# Patient Record
Sex: Male | Born: 1970
Health system: Southern US, Community
[De-identification: ages and names within clinical notes are randomized; demographics above are authoritative.]

## PROBLEM LIST (undated history)

## (undated) DIAGNOSIS — I10 Essential (primary) hypertension: Secondary | ICD-10-CM

## (undated) DIAGNOSIS — K5090 Crohn's disease, unspecified, without complications: Secondary | ICD-10-CM

## (undated) DIAGNOSIS — R7303 Prediabetes: Secondary | ICD-10-CM

## (undated) HISTORY — PX: SCROTUM EXPLORATION: SHX2389

---

## 2008-05-20 ENCOUNTER — Ambulatory Visit (HOSPITAL_COMMUNITY): Admission: RE | Admit: 2008-05-20 | Discharge: 2008-05-21 | Payer: Self-pay | Admitting: Urology

## 2010-05-13 LAB — COMPREHENSIVE METABOLIC PANEL
Albumin: 3.2 g/dL — ABNORMAL LOW (ref 3.5–5.2)
Alkaline Phosphatase: 71 U/L (ref 39–117)
BUN: 13 mg/dL (ref 6–23)
Potassium: 4 mEq/L (ref 3.5–5.1)
Total Protein: 7.3 g/dL (ref 6.0–8.3)

## 2010-05-13 LAB — ANAEROBIC CULTURE

## 2010-05-13 LAB — CBC
HCT: 36.8 % — ABNORMAL LOW (ref 39.0–52.0)
HCT: 39.5 % (ref 39.0–52.0)
Hemoglobin: 12.7 g/dL — ABNORMAL LOW (ref 13.0–17.0)
MCHC: 34.5 g/dL (ref 30.0–36.0)
MCV: 82.8 fL (ref 78.0–100.0)
Platelets: 202 10*3/uL (ref 150–400)
RBC: 4.45 MIL/uL (ref 4.22–5.81)
RDW: 15 % (ref 11.5–15.5)

## 2010-05-13 LAB — CULTURE, ROUTINE-ABSCESS

## 2010-06-16 NOTE — Op Note (Signed)
[name], [name] NO.:  [id number]   MEDICAL RECORD NO.:  [id number]          PATIENT TYPE:  AMB   LOCATION:  DAY                          FACILITY:  [facility]   PHYSICIAN:  Clayton Obrien, M.D.    DATE OF BIRTH:  03-24-1970   DATE OF PROCEDURE:  05/20/2008  DATE OF DISCHARGE:                               OPERATIVE REPORT   PREOPERATIVE DIAGNOSIS:  Scrotal abscess.   POSTOPERATIVE DIAGNOSIS:  Fournier's gangrene.   PROCEDURE:  Scrotal exploration with debridement of necrotic tissue.   SURGEON:  Dr. Bjorn Pippin.   ANESTHESIA:  General.   SPECIMEN:  Wound cultures and debrided tissue.   DRAINS:  None.   COMPLICATIONS:  None.   INDICATIONS:  Clayton Obrien is a 40 year old African American male who had the  onset last Thursday of a small blow on the penis.  He attempted to  express pus and after that it became more inflamed, he developed a  fever.  He was seen at [facility] over the weekend where I and D was  performed.  He followed up earlier today with them and they felt that he  was having a continuing problem.  He had been given Rocephin and Septra.  He was seen in the office where he had two areas of incision that were  packed with somewhat desquamating scanned with an additional area of  purulent drainage.  It was felt that formal I and D was indicated.   FINDINGS AND PROCEDURE:  The patient was taken to the operating room.  He was given Cipro and Unasyn.  A general anesthetic was induced.  He  was placed in lithotomy position.  His perineum and genitalia were  prepped with Betadine solution and he was draped in the usual sterile  fashion.  The packing had been removed prior to prepping.  Once prepped  and draped, a timeout was performed.  He then underwent initial incision  between the two areas of prior incision, exposing a large abscess  cavity.  However, on inspection there appeared to be necrotic tissue at  the edges of the wound with the overlying  areas of skin desquamated.  Further probing revealed progression of the infection into the space  around the base of the penis and toward the left inguinal area.  Additional pockets of pus were broken down and areas of necrotic  appearing subdartos fascia was identified.  This was dissected out back  to healthy tissue.  Once all areas of devitalized tissue were identified  and debrided, packing was performed using a 1-  inch Iodoform gauze.  Once the area had been packed and no additional  pockets of pus identified, a 4x4 dressing was applied followed by fluff  Kerlix followed by a scrotal support.  The patient's anesthetic was  reversed.  He was then moved to the recovery room in stable condition.  There were no complications.      Clayton Obrien, M.D.  Electronically Signed     JJW/MEDQ  D:  05/20/2008  T:  05/20/2008  Job:  [id number]   cc:  [facility]  [place]

## 2017-09-30 ENCOUNTER — Ambulatory Visit
Admission: RE | Admit: 2017-09-30 | Discharge: 2017-09-30 | Disposition: A | Discharge status: Home / Self Care | Payer: Managed Care, Other (non HMO) | Source: Ambulatory Visit | Attending: Gastroenterology | Admitting: Gastroenterology

## 2017-09-30 ENCOUNTER — Other Ambulatory Visit: Payer: Self-pay | Admitting: Gastroenterology

## 2017-09-30 DIAGNOSIS — K5010 Crohn's disease of large intestine without complications: Secondary | ICD-10-CM

## 2019-10-20 ENCOUNTER — Emergency Department (HOSPITAL_COMMUNITY)
Admission: EM | Admit: 2019-10-20 | Discharge: 2019-10-21 | Disposition: A | Discharge status: Home / Self Care | Payer: Managed Care, Other (non HMO) | Attending: Emergency Medicine | Admitting: Emergency Medicine

## 2019-10-20 DIAGNOSIS — Z5321 Procedure and treatment not carried out due to patient leaving prior to being seen by health care provider: Secondary | ICD-10-CM | POA: Diagnosis not present

## 2019-10-20 DIAGNOSIS — W5652XA Struck by other fish, initial encounter: Secondary | ICD-10-CM | POA: Diagnosis not present

## 2019-10-20 DIAGNOSIS — S8992XA Unspecified injury of left lower leg, initial encounter: Secondary | ICD-10-CM | POA: Diagnosis present

## 2019-10-20 HISTORY — DX: Essential (primary) hypertension: I10

## 2019-10-21 ENCOUNTER — Encounter (HOSPITAL_COMMUNITY): Payer: Self-pay

## 2019-10-21 NOTE — ED Triage Notes (Signed)
Pt arrives to ED w/ fishing hook impaled in LLE. Pt denies pain at this time. Bleeding controlled.

## 2019-10-21 NOTE — ED Notes (Signed)
PA at triage to eval pt.  No answer in waiting room x 2

## 2019-10-21 NOTE — ED Notes (Signed)
No answer for treatment room. 

## 2020-05-23 ENCOUNTER — Other Ambulatory Visit: Payer: Self-pay | Admitting: Family Medicine

## 2020-05-23 DIAGNOSIS — N480 Leukoplakia of penis: Secondary | ICD-10-CM

## 2020-05-23 DIAGNOSIS — N35911 Unspecified urethral stricture, male, meatal: Secondary | ICD-10-CM

## 2020-05-23 DIAGNOSIS — N390 Urinary tract infection, site not specified: Secondary | ICD-10-CM

## 2020-06-06 ENCOUNTER — Ambulatory Visit
Admission: RE | Admit: 2020-06-06 | Discharge: 2020-06-06 | Disposition: A | Discharge status: Home / Self Care | Payer: 59 | Source: Ambulatory Visit | Attending: Family Medicine | Admitting: Family Medicine

## 2020-06-06 ENCOUNTER — Encounter (INDEPENDENT_AMBULATORY_CARE_PROVIDER_SITE_OTHER): Payer: Self-pay

## 2020-06-06 DIAGNOSIS — N35911 Unspecified urethral stricture, male, meatal: Secondary | ICD-10-CM

## 2020-06-06 DIAGNOSIS — N390 Urinary tract infection, site not specified: Secondary | ICD-10-CM

## 2020-06-06 DIAGNOSIS — N480 Leukoplakia of penis: Secondary | ICD-10-CM

## 2020-06-06 MED ORDER — IOPAMIDOL (ISOVUE-300) INJECTION 61%
100.0000 mL | Freq: Once | INTRAVENOUS | Status: AC | PRN
  Administered 2020-06-06: 100 mL via INTRAVENOUS

## 2020-06-17 ENCOUNTER — Other Ambulatory Visit: Payer: Self-pay | Admitting: Urology

## 2020-07-03 NOTE — Patient Instructions (Signed)
DUE TO COVID-19 ONLY ONE VISITOR IS ALLOWED TO COME WITH YOU AND STAY IN THE WAITING ROOM ONLY DURING PRE OP AND PROCEDURE DAY OF SURGERY. THE 1 VISITOR  MAY VISIT WITH YOU AFTER SURGERY IN YOUR PRIVATE ROOM DURING VISITING HOURS ONLY!               Clayton Obrien   Your procedure is scheduled on: 07/11/20   Report to [facility] Main  Entrance   Report to short stay at: 5:15 AM     Call this number if you have problems the morning of surgery [phone]    Remember: Do not eat food or drink liquids :After Midnight.   BRUSH YOUR TEETH MORNING OF SURGERY AND RINSE YOUR MOUTH OUT, NO CHEWING GUM CANDY OR MINTS.    Take these medicines the morning of surgery with A SIP OF WATER: azathioprine.                               You may not have any metal on your body including hair pins and              piercings  Do not wear jewelry, lotions, powders or perfumes, deodorant.              Men may shave face and neck.   Do not bring valuables to the hospital. [place] IS NOT             RESPONSIBLE   FOR VALUABLES.  Contacts, dentures or bridgework may not be worn into surgery.  Leave suitcase in the car. After surgery it may be brought to your room.     Patients discharged the day of surgery will not be allowed to drive home. IF YOU ARE HAVING SURGERY AND GOING HOME THE SAME DAY, YOU MUST HAVE AN ADULT TO DRIVE YOU HOME AND BE WITH YOU FOR 24 HOURS. YOU MAY GO HOME BY TAXI OR UBER OR ORTHERWISE, BUT AN ADULT MUST ACCOMPANY YOU HOME AND STAY WITH YOU FOR 24 HOURS.  Name and phone number of your driver:  Special Instructions: N/A              Please read over the following fact sheets you were given: _____________________________________________________________________         [facility] - Preparing for Surgery Before surgery, you can play an important role.  Because skin is not sterile, your skin needs to be as free of germs as possible.  You can reduce the number of germs on  your skin by washing with CHG (chlorahexidine gluconate) soap before surgery.  CHG is an antiseptic cleaner which kills germs and bonds with the skin to continue killing germs even after washing. Please DO NOT use if you have an allergy to CHG or antibacterial soaps.  If your skin becomes reddened/irritated stop using the CHG and inform your nurse when you arrive at Short Stay. Do not shave (including legs and underarms) for at least 48 hours prior to the first CHG shower.  You may shave your face/neck. Please follow these instructions carefully:  1.  Shower with CHG Soap the night before surgery and the  morning of Surgery.  2.  If you choose to wash your hair, wash your hair first as usual with your  normal  shampoo.  3.  After you shampoo, rinse your hair and body thoroughly to remove the  shampoo.  4.  Use CHG as you would any other liquid soap.  You can apply chg directly  to the skin and wash                       Gently with a scrungie or clean washcloth.  5.  Apply the CHG Soap to your body ONLY FROM THE NECK DOWN.   Do not use on face/ open                           Wound or open sores. Avoid contact with eyes, ears mouth and genitals (private parts).                       Wash face,  Genitals (private parts) with your normal soap.             6.  Wash thoroughly, paying special attention to the area where your surgery  will be performed.  7.  Thoroughly rinse your body with warm water from the neck down.  8.  DO NOT shower/wash with your normal soap after using and rinsing off  the CHG Soap.                9.  Pat yourself dry with a clean towel.            10.  Wear clean pajamas.            11.  Place clean sheets on your bed the night of your first shower and do not  sleep with pets. Day of Surgery : Do not apply any lotions/deodorants the morning of surgery.  Please wear clean clothes to the hospital/surgery center.  FAILURE TO FOLLOW THESE INSTRUCTIONS MAY  RESULT IN THE CANCELLATION OF YOUR SURGERY PATIENT SIGNATURE_________________________________  NURSE SIGNATURE__________________________________  ________________________________________________________________________

## 2020-07-04 ENCOUNTER — Encounter (HOSPITAL_COMMUNITY): Payer: Self-pay

## 2020-07-04 ENCOUNTER — Other Ambulatory Visit: Payer: Self-pay

## 2020-07-04 ENCOUNTER — Encounter (HOSPITAL_COMMUNITY)
Admission: RE | Admit: 2020-07-04 | Discharge: 2020-07-04 | Disposition: A | Discharge status: Home / Self Care | Payer: 59 | Source: Ambulatory Visit | Attending: Urology | Admitting: Urology

## 2020-07-04 DIAGNOSIS — Z01818 Encounter for other preprocedural examination: Secondary | ICD-10-CM | POA: Diagnosis not present

## 2020-07-04 HISTORY — DX: Prediabetes: R73.03

## 2020-07-04 HISTORY — DX: Crohn's disease, unspecified, without complications: K50.90

## 2020-07-04 LAB — CBC
HCT: 43.8 % (ref 39.0–52.0)
Hemoglobin: 14 g/dL (ref 13.0–17.0)
MCH: 26.5 pg (ref 26.0–34.0)
MCHC: 32 g/dL (ref 30.0–36.0)
MCV: 83 fL (ref 80.0–100.0)
Platelets: 284 10*3/uL (ref 150–400)
RBC: 5.28 MIL/uL (ref 4.22–5.81)
RDW: 16.5 % — ABNORMAL HIGH (ref 11.5–15.5)
WBC: 8.2 10*3/uL (ref 4.0–10.5)
nRBC: 0 % (ref 0.0–0.2)

## 2020-07-04 LAB — BASIC METABOLIC PANEL
Anion gap: 10 (ref 5–15)
BUN: 23 mg/dL — ABNORMAL HIGH (ref 6–20)
CO2: 26 mmol/L (ref 22–32)
Calcium: 10 mg/dL (ref 8.9–10.3)
Chloride: 102 mmol/L (ref 98–111)
Creatinine, Ser: 1.11 mg/dL (ref 0.61–1.24)
GFR, Estimated: >60 mL/min (ref >60)
Glucose, Bld: 118 mg/dL — ABNORMAL HIGH (ref 70–99)
Potassium: 4.5 mmol/L (ref 3.5–5.1)
Sodium: 138 mmol/L (ref 135–145)

## 2020-07-04 NOTE — Progress Notes (Signed)
COVID Vaccine Completed: Yes Date COVID Vaccine completed: 01/2020 [place] COVID vaccine manufacturer:   Moderna     PCP - Dr. Mila Palmer Cardiologist -   Chest x-ray -  EKG -  Stress Test -  ECHO -  Cardiac Cath -  Pacemaker/ICD device last checked:  Sleep Study -  CPAP -   Fasting Blood Sugar -  Checks Blood Sugar _____ times a day  Blood Thinner Instructions: Aspirin Instructions: Last Dose:  Anesthesia review:   Patient denies shortness of breath, fever, cough and chest pain at PAT appointment   Patient verbalized understanding of instructions that were given to them at the PAT appointment. Patient was also instructed that they will need to review over the PAT instructions again at home before surgery.

## 2020-07-05 LAB — HEMOGLOBIN A1C
Hgb A1c MFr Bld: 6.2 % — ABNORMAL HIGH (ref 4.8–5.6)
Mean Plasma Glucose: 131 mg/dL

## 2020-07-10 MED ORDER — GENTAMICIN SULFATE 40 MG/ML IJ SOLN
5.0000 mg/kg | INTRAVENOUS | Status: AC
  Administered 2020-07-11: 480 mg via INTRAVENOUS
  Filled 2020-07-10: qty 11.5

## 2020-07-10 NOTE — Op Note (Signed)
Operative Note  Preoperative diagnosis:  1.  Right stone 2. Urethral stricture  Postoperative diagnosis: 1.  Right renal stone 2. Urethral stricture  Procedure(s): 1.  Cystoscopy 2. Urethral dilation 3. Right ureteroscopy with laser lithotripsy and basket extraction of stones 4. Right retrograde pyelogram 5. Right ureteral stent placement 6. Fluoroscopy with intraoperative interpretation  Surgeon: Jettie Pagan, MD  Assistants:  None  Anesthesia:  General  Complications:  None  EBL:  Minimal  Specimens: 1. Stones for stone analysis (to be done at [facility])  Drains/Catheters: 1.  Right 6Fr x 26cm ureteral stent WITHOUT a tether string  Intraoperative findings:   Cystoscopy demonstrated pan urethral stricture from the meatus through the bulbar urethra.  Caliber was initially 8 [place].  The fossa navicularis was dilated from 8 [place] to 24 [place] with [employer].  Next a wire was placed into the bladder and the bulbar urethra was dilated using Hayman sounds from 12 [place] to 24 [place].  Cystoscopy demonstrated no suspicious bladder lesions.  His bilateral ureteral orifices were in their normal orthotopic position Ureteroscopy demonstrated 1.7 cm right renal pelvis stone which was actually quite soft.  It dusted excellently.  I was able to basket extract small to medium sized fragments.  The remainder of the fragments were scanned.  Ureteroscopy after placing access sheath did demonstrate grade 1 injury at the right proximal ureter and right distal ureter which was subcentimeter and only on the medial portion of the ureter. Successful right ureteral stent placement with curl within the renal pelvis and bladder respectively.  Indication:  Clayton Obrien is a 50 y.o. male with CT A/P 06/07/2020 with partially obstructing 1.7 cm stone in the right renal pelvis.  On CT imaging, he was found to have a skin to skin distance of approximately 18 cm.  Given this, we elected  proceed with ureteroscopy knowing that we may have to require multiple attempts.  He is amenable to this approach and elected proceed with right ureteroscopy with lithotripsy.  Description of procedure: After informed consent was obtained from the patient, the patient was identified and taken to the operating room and placed in the supine position.  General anesthesia was administered as well as perioperative IV antibiotics.  At the beginning of the case, a time-out was performed to properly identify the patient, the surgery to be performed, and the surgical site.  Sequential compression devices were applied to the lower extremities at the beginning of the case for DVT prophylaxis.  The patient was then placed in the dorsal lithotomy supine position, prepped and draped in sterile fashion.  Preliminary scout fluoroscopy revealed that there was a 1.7 cm calcification area at the right renal pelvis, which corresponds to the stone found on the preoperative CT scan.  I then placed [employer] sounds to dilate his fossa navicularis from 12 [place] to 24 [place] successfully.  I then introduced a 21 French rigid cystoscope into the penile urethra.  I then encountered urethral narrowing at the proximal penile urethra extending into the bulbar urethra.  I passed a 0.038 sensor wire into the bladder.  I left this in place throughout the case as a safety wire.  Over this wire, I did pass Hayman sounds from [place] to 26 [place].  Introduced a flexible cystoscope around the wire and surveyed the urethra.  There was evidence of splitting of the urethra with a small flap created anteriorly in the distal bulbar urethra.  I navigated the scope into  the bladder without difficulty.  I was unable to navigate 21 French rigid cystoscope the bladder.  He had a mildly throbbing prostate. A systematic evaluation of the bladder revealed no evidence of any suspicious bladder lesions.  Ureteral orifices were in normal position.    Under  cystoscopic and flouroscopic guidance, we cannulated the right ureteral orifice with a 5-French open-ended ureteral catheter and a gentle retrograde pyelogram was performed, revealing a normal caliber ureter without any filling defects. There was no hydronephrosis of the collecting system. There was a 1.7cm filling defect in the right renal pelvis corresponding to the stone. A 0.038 sensor wire was then passed up to the level of the renal pelvis and secured to the drape as a safety wire. The ureteral catheter and cystoscope were removed, leaving the safety wire in place.   A semi-rigid ureteroscope was passed alongside the wire up the distal ureter which appeared normal. A second 0.038 sensor wire was passed under direct vision and the semirigid scope was removed.  The inner lumen and then the complete 12/14 [place] ureteral access sheath was carefully advanced up the ureter to the level of the UPJ over this wire under fluoroscopic guidance without any resistance and without difficulty.  The flexible ureteroscope was advanced into the collecting system via the access sheath. The collecting system was inspected. The calculus was identified in the renal pelvis about 1.7 cm. Using the 200 micron holmium laser fiber, the stone was dusted completely.  The stone was actually quite soft and dusted very easily.  The pieces were essentially sand.  I did use a 0 tip basket and then in encompass basket and attempt to clear the small fragments.  I basket extracted multiple small fragments.  1 of these fragments for large.  At the end of this, all the fragments were essentially sand and much smaller than 1 mm.  With the ureteroscope in the kidney, a gentle pyelogram was performed to delineate the calyceal system and we evaluated the calyces systematically. We encountered a no further large stone fragments. The rest of the stone fragments were very tiny and these were  irrigated away gently. The calyces were re-inspected and  there were no significant stone fragment residual.   We then withdrew the ureteroscope back down the ureter along with the access sheath.  I did note a grade 1 injury subcentimeter at the right proximal ureter and was on the medial aspect of the ureter.  No fat was identified.  Subsequent retrograde pyelogram demonstrated no extravasation of contrast.  Also demonstrated a grade 1 injury subcentimeter in the distal right ureter also medial.  Subsequent right retrograde pyelogram demonstrated no extravasation of contrast.  identified.  Once the ureteroscope was removed, the safety sensor wire was backloaded through the rigid cystoscope, which was then advanced down the urethra and into the bladder. We then used the sensor wire under direct vision through the rigid cystoscope and under fluoroscopic guidance and passed up a 6-French, 26 cm double-pigtail ureteral stent up ureter, making sure that the proximal and distal ends coiled within the kidney and bladder respectively.  I did not leave a tether string. The cystoscope was then advanced back into the bladder under vision.  We were able to see the distal stent coiling nicely within the bladder.  The bladder was then emptied with irrigation solution.  The cystoscope was then removed.  I then placed an 46 [place] council catheter over the wire and it remained in the bladder.  The patient tolerated the procedure well and there was no complication. Patient was awoken from anesthesia and taken to the recovery room in stable condition. I was present and scrubbed for the entirety of the case.  Plan:  Patient will be discharged home.  He will remove his Foley catheter in 7 days given significant dilation of his pain urethral stricture.  He will then follow-up with me in 2 weeks for right ureteral stent removal given small subcentimeter grade 1 ureteral injury with ureteral access sheath.   Clayton R. Kathlee Barnhardt MD [facility]  Pager: [phone]

## 2020-07-10 NOTE — H&P (Signed)
Office Visit Report     06/12/2020   --------------------------------------------------------------------------------   Clayton Pam. Obrien  MRN: [id number]  DOB: [birth year], 50 year old Male  SSN: [id number]   PRIMARY CARE:  Mila Palmer, MD  REFERRING:  Louanna Raw, MD  PROVIDER:  Jettie Pagan, M.D.  LOCATION:  [facility] [phone]     --------------------------------------------------------------------------------   CC/HPI: Clayton Obrien is a 50 year old male seen in consultation today for a large right renal pelvis stone.   Patient states he has had recent recurrent urinary tract infections. This prompted CT A/P. CT A/P 06/07/2020 revealed a partially obstructing 1.7 cm stone in the right renal pelvis with mild caliectasis. There is also a tiny nonobstructing stone in the left lower pole. There is no evidence of ureteral stones. Patient does report a history of urolithiasis and has had medical expulsive therapy times several. He denies prior urologic intervention for stones. He denies abdominal pain or flank pain. He denies fevers or chills. He is on Bactrim as a suppressive therapy ordered by Dr. Retta Diones.   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 He dilates his urethra weekly.   Has a past medical history of hypertension hyperlipidemia.   Patient currently denies fever, chills, sweats, nausea, vomiting, abdominal or flank pain, gross hematuria or dysuria.     ALLERGIES: No Allergies    MEDICATIONS: Bactrim Ds 800 mg-160 mg tablet 1 p.o. q.12 hours x5 days, then 1 p.o. q.h.s.  Asprethine 25 Mg  Atenolol-Chlorthalidone 50 mg-25 mg tablet  Rosuvastatin Calcium 10 mg tablet     GU PSH: Complex Uroflow - 2019, 2019 Debride Genitalia & Perineum - 2010 Initial Male VB Sounds - 2018 Scrotal Exploration - 2010       PSH Notes: Surgery Scrotum Exploration, Debridement Skin/Muscle/Fascia For Necrot Infect Ext Genital, No  Surgical Problems   NON-GU PSH: None   GU PMH: Balanitis Xerotica Obliterans (Lichen sclerosis), Patient does well self dilating but does have significant distal urethral involvement. - 06/11/2020, BXO (balanitis xerotica obliterans), - 2016 Renal calculus, 21 mm right renal calculus. No significant symptomatology, but based on his UTI plus today, this is an infection stone with Proteus. This will require treatment. 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 I will get a culture and have covered him with Cipro. - 2018 Meatal stenosis (other urethral stricture) (Worsening), He was dilated with difficulty to 69fr. He will return in a month for a flowrate and then again in 3 months if doing well. If he recurs, he will need balloon dilation in the OR or consideration of a referral for reconstruction. - 2018 Postinfective urethral stricture, not elsewhere classified, male, meatal, The stricture extends proximally for about 3cm. - 2018 Other difficulties with micturition, Difficulty voiding - 2016 Scrotal abscess/inflammation, Abscess Of The Scrotum - 2014      PMH Notes:  2014-04-22 14:53:44 - Note: No acute medical problems  2008-05-20 11:38:39 - Note: No Medical Problems   NON-GU PMH: Encounter for general adult medical examination without abnormal findings, Encounter for preventive health examination - 2016 Crohn''s disease of small intestine without complications Hypercholesterolemia Hypertension Ulcerative Colitis    FAMILY HISTORY: Diabetes - Runs In Family Hypertension - Runs In Family Kidney Cancer - Runs In Family renal failure - Runs In Family   SOCIAL HISTORY: Marital  Status: Single Preferred Language: [place]; Ethnicity: Not Hispanic Or Latino; Race: Black or African American Current Smoking Status: Patient has never smoked.   Tobacco  Use Assessment Completed: Used Tobacco in last 30 days? Does not use smokeless tobacco. Does drink.  Does not drink caffeine. Patient's occupation Tourist information centre manager.     Notes: Never a smoker, Caffeine Use: rare use, Marital History - Single, Tobacco Use, Alcohol Use, Occupation:   REVIEW OF SYSTEMS:    GU Review Male:   Patient denies frequent urination, hard to postpone urination, burning/ pain with urination, get up at night to urinate, leakage of urine, stream starts and stops, trouble starting your stream, have to strain to urinate , erection problems, and penile pain.  Gastrointestinal (Upper):   Patient denies nausea, vomiting, and indigestion/ heartburn.  Gastrointestinal (Lower):   Patient denies diarrhea and constipation.  Constitutional:   Patient denies fever, night sweats, weight loss, and fatigue.  Skin:   Patient denies itching and skin rash/ lesion.  Eyes:   Patient denies blurred vision and double vision.  Ears/ Nose/ Throat:   Patient denies sore throat and sinus problems.  Hematologic/Lymphatic:   Patient denies swollen glands and easy bruising.  Cardiovascular:   Patient denies leg swelling and chest pains.  Respiratory:   Patient denies cough and shortness of breath.  Endocrine:   Patient denies excessive thirst.  Musculoskeletal:   Patient denies back pain and joint pain.  Neurological:   Patient denies headaches and dizziness.  Psychologic:   Patient denies depression and anxiety.   VITAL SIGNS: None   MULTI-SYSTEM PHYSICAL EXAMINATION:    Constitutional: Well-nourished. No physical deformities. Normally developed. Good grooming.  Respiratory: No labored breathing, no use of accessory muscles.   Cardiovascular: Normal temperature, normal extremity pulses, no swelling, no varicosities.  Gastrointestinal: No mass, no tenderness, no rigidity, obese     Complexity of Data:  Source Of History:  Patient, Medical Record Summary  Records Review:   Previous Doctor Records   Urine Test Review:   Urinalysis  X-Ray Review: C.T. Abdomen/Pelvis: Reviewed Films. Reviewed Report. Discussed With Patient.    Notes:                     CLINICAL DATA: Recurrent urinary tract infection. Urethral  stricture. Symptoms for 1 month. No previous relevant surgery or  history of malignancy.   EXAM:  CT ABDOMEN AND PELVIS WITHOUT AND WITH CONTRAST   TECHNIQUE:  Multidetector CT imaging of the abdomen and pelvis was performed  following the standard protocol before and following the bolus  administration of intravenous contrast.   CONTRAST: ISOVUE-300 IOPAMIDOL (ISOVUE-300) INJECTION 61%   COMPARISON: None.   FINDINGS:  Lower chest: Clear lung bases. No significant pleural or pericardial  effusion. Mild coronary artery atherosclerosis noted.   Hepatobiliary: The liver is normal in density without suspicious  focal abnormality. No evidence of gallstones, gallbladder wall  thickening or biliary dilatation.   Pancreas: Unremarkable. No pancreatic ductal dilatation or  surrounding inflammatory changes.   Spleen: Normal in size without focal abnormality.   Adrenals/Urinary Tract: Both adrenal glands appear normal. Pre  contrast images demonstrate a large calculus in the right renal  pelvis, measuring up to 1.7 cm on image 44/2. This appears visible  on the scout image. There is a punctate 3 mm nonobstructing calculus  in the lower pole of the left kidney. No evidence of ureteral or  bladder calculus. Post-contrast, both kidneys enhance normally.  There is no evidence of enhancing renal mass. There are low-density  renal cysts bilaterally, measuring up to 1.7 cm posteriorly in the  mid right kidney (image 25/20) and 2.4 cm on the left (image 23/20).  Delayed post-contrast images through the kidneys demonstrate mild  right-sided caliectasis and delayed contrast excretion on the right  with non opacification of the proximal right ureter. There is no  collecting  system dilatation on the left or perinephric soft tissue  stranding. There is mild bladder wall thickening which may relate to  incomplete distension. No focal bladder abnormality identified.   Stomach/Bowel: No enteric contrast administered. The stomach appears  unremarkable for its degree of distension. No evidence of bowel wall  thickening, distention or surrounding inflammatory change. The  appendix appears normal.   Vascular/Lymphatic: There are no enlarged abdominal or pelvic lymph  nodes. No significant vascular findings.   Reproductive: The prostate gland and seminal vesicles appear  unremarkable.   Other: Small umbilical hernia containing only fat. No ascites.   Musculoskeletal: No acute or significant osseous findings. Mild  facet disease in the lumbar spine.   IMPRESSION:  1. Partially obstructing large (1.7 cm) calculus in the right renal  pelvis with mild caliectasis and delayed contrast excretion.  2. Tiny nonobstructing calculus in the lower pole of the left  kidney. No evidence of ureteral calculus or perinephric soft tissue  stranding.  3. No evidence of renal mass. Small bilateral renal cysts.  4. Mild nonspecific bladder wall thickening without apparent focal  abnormality.  5. Mild coronary artery atherosclerosis.    Electronically Signed  By: Carey Bullocks M.D.  On: 06/07/2020 17:26   PROCEDURES:          Urinalysis w/Scope Dipstick Dipstick Cont'd Micro  Color: Yellow Bilirubin: Neg mg/dL WBC/hpf: 40 - 23/NTI  Appearance: Cloudy Ketones: Neg mg/dL RBC/hpf: 10 - 14/ERX  Specific Gravity: 1.025 Blood: 3+ ery/uL Bacteria: Few (10-25/hpf)  pH: 6.0 Protein: 1+ mg/dL Cystals: NS (Not Seen)  Glucose: Neg mg/dL Urobilinogen: 0.2 mg/dL Casts: NS (Not Seen)    Nitrites: Neg Trichomonas: Not Present    Leukocyte Esterase: 3+ leu/uL Mucous: Not Present      Epithelial Cells: NS (Not Seen)      Yeast: NS (Not Seen)      Sperm: Not Present    ASSESSMENT:       ICD-10 Details  1 GU:   Acute Cystitis/UTI - N30.00   2   Balanitis Xerotica Obliterans (Lichen sclerosis) - N48.0   3   Renal calculus - N20.0   4   Urethral Stricture, Unspec - N35.9    PLAN:           Orders Labs CULTURE, URINE          Document Letter(s):  Created for Patient: Clinical Summary         Notes:    1. Right renal pelvis stone: Measures 1.7 cm on CT A/P 06/07/2020. Unfortunately, his skin the stone distance is about 18 cm. We discussed elevated risk for PCNL when this case including potential inability to access the stone even with a long renal access sheath. Given this, I recommend staged ureteroscopy. We discussed stone risk as below. We discussed stone prevention as below. Obtain urine for culture. Continue Bactrim as a suppressive therapy.   We discussed the options for management of kidney stones, including observation, ESWL, ureteroscopy with laser lithotripsy, and PCNL. The risks and benefits of each option were discussed.  For  observation I described the risks which include but are not limited to silent renal damage, life-threatening infection, need for emergent surgery, failure to pass stone, and pain.   ESWL: risks and benefits of ESWL were outlined including infection, bleeding, pain, steinstrasse, kidney injury, need for ancillary treatments, and global anesthesia risks including but not limited to CVA, MI, DVT, PE, pneumonia, and death.   Ureteroscopy: risks and benefits of ureteroscopy were outlined, including infection, bleeding, pain, temporary ureteral stent and associated stent bother, ureteral injury, ureteral stricture, need for ancillary treatments, and global anesthesia risks including but not limited to CVA, MI, DVT, PE, pneumonia, and death.   PCNL: risks and benefits of PCNL were outlined including infection, bleeding, blood transfusion, pain, pneumothorax, bowel injury, persistent urine leak, positioning injury, inability to clear stone burden,  renal laceration, arterial venous fistula or malformation, need for ancillary treatments, and global anesthesia risks including but not limited to CVA, MI, DVT, PE, pneumonia, and death.   We discussed dietary methods for stone prevention including the following: increased water intake to 2-3 liters per day, add lemon or lemon concentrate to water to increase citrate which is beneficial for stone prevention, limiting dietary sodium to less than 2000 mg per day, limiting animal protein to less than 2 servings (16 ounces/day), and limiting foods high in oxalate content (spinach, beans, chocolate, etc.).    #2. Urethral stricture: Continue self dilation per urethra weekly.   Urology Preoperative H&P   Chief Complaint: Right renal pelvis stone  History of Present Illness: Clayton Obrien is a 50 y.o. male with 1.7cm right renal pelvis stone here for cysto, R RPG, R URS/LL, R stent placement. He denies fevers, chills, dysuria. Ucx 06/25/2020 NG.    Past Medical History:  Diagnosis Date   Crohn's disease (HCC)    Hypertension    Pre-diabetes     Past Surgical History:  Procedure Laterality Date   SCROTUM EXPLORATION     removal of a boil    Allergies: No Known Allergies  No family history on file.  Social History:  reports that he has never smoked. He has never used smokeless tobacco. He reports current alcohol use. He reports that he does not use drugs.  ROS: A complete review of systems was performed.  All systems are negative except for pertinent findings as noted.  Physical Exam:  Vital signs in last 24 hours:   Constitutional:  Alert and oriented, No acute distress Cardiovascular: Regular rate and rhythm Respiratory: Normal respiratory effort, Lungs clear bilaterally GI: Abdomen is soft, nontender, nondistended, no abdominal masses GU: No CVA tenderness Lymphatic: No lymphadenopathy Neurologic: Grossly intact, no focal deficits Psychiatric: Normal mood and  affect  Laboratory Data:  No results for input(s): WBC, HGB, HCT, PLT in the last 72 hours.  No results for input(s): NA, K, CL, GLUCOSE, BUN, CALCIUM, CREATININE in the last 72 hours.  Invalid input(s): CO3   No results found for this or any previous visit (from the past 24 hour(s)). No results found for this or any previous visit (from the past 240 hour(s)).  Renal Function: Recent Labs    07/04/20 0825  CREATININE 1.11   Estimated Creatinine Clearance: 106 mL/min (by C-G formula based on SCr of 1.11 mg/dL).  Radiologic Imaging: No results found.  I independently reviewed the above imaging studies.  Assessment and Plan Clayton CoyerRodney D Bardon is a 50 y.o. male with 1.7cm right renal pelvis stone here for cysto, R RPG, R URS/LL, R stent  placement. He denies fevers, chills, dysuria. Ucx 06/25/2020 NG.  -The risks, benefits and alternatives of cystoscopy with R URS/LL, R JJ stent placement was discussed with the patient.  Risks include, but are not limited to: bleeding, urinary tract infection, ureteral injury, ureteral stricture disease, chronic pain, urinary symptoms, bladder injury, stent migration, the need for nephrostomy tube placement, inability to access right ureter, need for repeat ureteroscopy, MI, CVA, DVT, PE and the inherent risks with general anesthesia.  The patient voices understanding and wishes to proceed.   Matt R. Gyneth Hubka MD 07/10/2020, 10:40 PM  [facility] Pager: [phone]

## 2020-07-10 NOTE — Discharge Instructions (Addendum)
[facility] [phone] Post Ureteroscopy With or Without Stent Instructions  Definitions:  Ureter: The duct that transports urine from the kidney to the bladder. Stent:   A plastic hollow tube that is placed into the ureter, from the kidney to the bladder to prevent the ureter from swelling shut.  GENERAL INSTRUCTIONS:  Despite the fact that no skin incisions were used, the area around the ureter and bladder is raw and irritated. The stent is a foreign body which will further irritate the bladder wall. This irritation is manifested by increased frequency of urination, both day and night, and by an increase in the urge to urinate. In some, the urge to urinate is present almost always. Sometimes the urge is strong enough that you may not be able to stop yourself from urinating. The only real cure is to remove the stent and then give time for the bladder wall to heal which can't be done until the danger of the ureter swelling shut has passed, which varies.  You may see some blood in your urine while the stent is in place and a few days afterwards. Do not be alarmed, even if the urine was clear for a while. Get off your feet and drink lots of fluids until clearing occurs. If you start to pass clots or don't improve, call us.  DIET: You may return to your normal diet immediately. Because of the raw surface of your bladder, alcohol, spicy foods, acid type foods and drinks with caffeine may cause irritation or frequency and should be used in moderation. To keep your urine flowing freely and to avoid constipation, drink plenty of fluids during the day ( 8-10 glasses ). Tip: Avoid cranberry juice because it is very acidic.  ACTIVITY: Your physical activity doesn't need to be restricted. However, if you are very active, you may see some blood in your urine. We suggest that you reduce your activity under these circumstances until the bleeding has stopped.  BOWELS: It is important to  keep your bowels regular during the postoperative period. Straining with bowel movements can cause bleeding. A bowel movement every other day is reasonable. Use a mild laxative if needed, such as Milk of Magnesia 2-3 tablespoons, or 2 Dulcolax tablets. Call if you continue to have problems. If you have been taking narcotics for pain, before, during or after your surgery, you may be constipated. Take a laxative if necessary.   MEDICATION: You should resume your pre-surgery medications unless told not to. In addition you will often be given an antibiotic to prevent infection. These should be taken as prescribed until the bottles are finished unless you are having an unusual reaction to one of the drugs.  PROBLEMS YOU SHOULD REPORT TO [place]: Fevers over 100.5 Fahrenheit. Heavy bleeding, or clots ( See above notes about blood in urine ). Inability to urinate. Drug reactions ( hives, rash, nausea, vomiting, diarrhea ). Severe burning or pain with urination that is not improving.  FOLLOW-UP: You will need a follow-up appointment to monitor your progress. Call for this appointment at the number listed above. Usually the first appointment will be about three to fourteen days after your surgery.  You have a foley catheter in place. You may remove this foley at home with provided 47ml syringe on Friday early AM at 6/17. I had to dilate your urethral stricture so we needed to leave a foley in place to allow urethra to heal. You will followup in the office on 6/27 at 8:15AM for  stent removal.

## 2020-07-11 ENCOUNTER — Ambulatory Visit (HOSPITAL_COMMUNITY)
Admission: RE | Admit: 2020-07-11 | Discharge: 2020-07-11 | Disposition: A | Discharge status: Home / Self Care | Payer: 59 | Attending: Urology | Admitting: Urology

## 2020-07-11 ENCOUNTER — Ambulatory Visit (HOSPITAL_COMMUNITY): Payer: 59 | Admitting: Anesthesiology

## 2020-07-11 ENCOUNTER — Encounter (HOSPITAL_COMMUNITY): Payer: Self-pay | Admitting: Urology

## 2020-07-11 ENCOUNTER — Encounter (HOSPITAL_COMMUNITY)
Admission: RE | Disposition: A | Discharge status: Home / Self Care | Payer: Self-pay | Source: Home / Self Care | Attending: Urology

## 2020-07-11 ENCOUNTER — Ambulatory Visit (HOSPITAL_COMMUNITY): Payer: 59

## 2020-07-11 DIAGNOSIS — N200 Calculus of kidney: Secondary | ICD-10-CM | POA: Diagnosis not present

## 2020-07-11 DIAGNOSIS — I10 Essential (primary) hypertension: Secondary | ICD-10-CM | POA: Insufficient documentation

## 2020-07-11 DIAGNOSIS — I2510 Atherosclerotic heart disease of native coronary artery without angina pectoris: Secondary | ICD-10-CM | POA: Insufficient documentation

## 2020-07-11 DIAGNOSIS — N281 Cyst of kidney, acquired: Secondary | ICD-10-CM | POA: Insufficient documentation

## 2020-07-11 DIAGNOSIS — Z841 Family history of disorders of kidney and ureter: Secondary | ICD-10-CM | POA: Insufficient documentation

## 2020-07-11 DIAGNOSIS — Z8719 Personal history of other diseases of the digestive system: Secondary | ICD-10-CM | POA: Diagnosis not present

## 2020-07-11 DIAGNOSIS — Z87442 Personal history of urinary calculi: Secondary | ICD-10-CM | POA: Diagnosis not present

## 2020-07-11 DIAGNOSIS — N35919 Unspecified urethral stricture, male, unspecified site: Secondary | ICD-10-CM | POA: Insufficient documentation

## 2020-07-11 DIAGNOSIS — Z8249 Family history of ischemic heart disease and other diseases of the circulatory system: Secondary | ICD-10-CM | POA: Diagnosis not present

## 2020-07-11 DIAGNOSIS — E785 Hyperlipidemia, unspecified: Secondary | ICD-10-CM | POA: Diagnosis not present

## 2020-07-11 DIAGNOSIS — Z8051 Family history of malignant neoplasm of kidney: Secondary | ICD-10-CM | POA: Diagnosis not present

## 2020-07-11 HISTORY — PX: CYSTOSCOPY/URETEROSCOPY/HOLMIUM LASER/STENT PLACEMENT: SHX6546

## 2020-07-11 SURGERY — CYSTOSCOPY/URETEROSCOPY/HOLMIUM LASER/STENT PLACEMENT
Anesthesia: General | Laterality: Right

## 2020-07-11 MED ORDER — TAMSULOSIN HCL 0.4 MG PO CAPS
0.4000 mg | ORAL_CAPSULE | Freq: Every day | ORAL | 1 refills | Status: DC
Start: 2020-07-11 — End: 2021-05-21

## 2020-07-11 MED ORDER — PROPOFOL 10 MG/ML IV BOLUS
INTRAVENOUS | Status: AC
  Filled 2020-07-11: qty 20

## 2020-07-11 MED ORDER — ONDANSETRON HCL 4 MG/2ML IJ SOLN
INTRAMUSCULAR | Status: AC
  Filled 2020-07-11: qty 2

## 2020-07-11 MED ORDER — DOCUSATE SODIUM 100 MG PO CAPS: 100.0000 mg | ORAL_CAPSULE | Freq: Every day | ORAL | 0 refills | Status: AC | PRN

## 2020-07-11 MED ORDER — KETOROLAC TROMETHAMINE 30 MG/ML IJ SOLN
INTRAMUSCULAR | Status: DC | PRN
  Administered 2020-07-11: 30 mg via INTRAVENOUS

## 2020-07-11 MED ORDER — ACETAMINOPHEN 325 MG PO TABS: 325.0000 mg | ORAL_TABLET | ORAL | Status: DC | PRN

## 2020-07-11 MED ORDER — DEXAMETHASONE SODIUM PHOSPHATE 10 MG/ML IJ SOLN
INTRAMUSCULAR | Status: DC | PRN
  Administered 2020-07-11: 10 mg via INTRAVENOUS

## 2020-07-11 MED ORDER — AMISULPRIDE (ANTIEMETIC) 5 MG/2ML IV SOLN: 10.0000 mg | Freq: Once | INTRAVENOUS | Status: DC | PRN

## 2020-07-11 MED ORDER — IOHEXOL 300 MG/ML  SOLN
INTRAMUSCULAR | Status: DC | PRN
  Administered 2020-07-11: 8 mL

## 2020-07-11 MED ORDER — PROPOFOL 10 MG/ML IV BOLUS
INTRAVENOUS | Status: DC | PRN
  Administered 2020-07-11: 30 mg via INTRAVENOUS
  Administered 2020-07-11: 200 mg via INTRAVENOUS
  Administered 2020-07-11: 20 mg via INTRAVENOUS

## 2020-07-11 MED ORDER — CEPHALEXIN 500 MG PO CAPS: 500.0000 mg | ORAL_CAPSULE | Freq: Two times a day (BID) | ORAL | 0 refills | Status: AC

## 2020-07-11 MED ORDER — ACETAMINOPHEN 10 MG/ML IV SOLN: 1000.0000 mg | Freq: Once | INTRAVENOUS | Status: DC | PRN

## 2020-07-11 MED ORDER — ONDANSETRON HCL 4 MG/2ML IJ SOLN
INTRAMUSCULAR | Status: DC | PRN
  Administered 2020-07-11: 4 mg via INTRAVENOUS

## 2020-07-11 MED ORDER — FENTANYL CITRATE (PF) 100 MCG/2ML IJ SOLN
INTRAMUSCULAR | Status: DC | PRN
  Administered 2020-07-11 (×4): 50 ug via INTRAVENOUS
  Administered 2020-07-11 (×2): 25 ug via INTRAVENOUS
  Administered 2020-07-11: 50 ug via INTRAVENOUS

## 2020-07-11 MED ORDER — LIDOCAINE 2% (20 MG/ML) 5 ML SYRINGE
INTRAMUSCULAR | Status: DC | PRN
  Administered 2020-07-11: 40 mg via INTRAVENOUS
  Administered 2020-07-11: 60 mg via INTRAVENOUS

## 2020-07-11 MED ORDER — LACTATED RINGERS IV SOLN: INTRAVENOUS | Status: DC

## 2020-07-11 MED ORDER — FENTANYL CITRATE (PF) 100 MCG/2ML IJ SOLN: 25.0000 ug | INTRAMUSCULAR | Status: DC | PRN

## 2020-07-11 MED ORDER — FENTANYL CITRATE (PF) 100 MCG/2ML IJ SOLN
INTRAMUSCULAR | Status: AC
  Filled 2020-07-11: qty 2

## 2020-07-11 MED ORDER — MIDAZOLAM HCL 2 MG/2ML IJ SOLN
INTRAMUSCULAR | Status: AC
  Filled 2020-07-11: qty 2

## 2020-07-11 MED ORDER — PROMETHAZINE HCL 25 MG/ML IJ SOLN: 6.2500 mg | INTRAMUSCULAR | Status: DC | PRN

## 2020-07-11 MED ORDER — LIDOCAINE 2% (20 MG/ML) 5 ML SYRINGE
INTRAMUSCULAR | Status: AC
  Filled 2020-07-11: qty 5

## 2020-07-11 MED ORDER — ORAL CARE MOUTH RINSE: 15.0000 mL | Freq: Once | OROMUCOSAL | Status: AC

## 2020-07-11 MED ORDER — ACETAMINOPHEN 160 MG/5ML PO SOLN
325.0000 mg | ORAL | Status: DC | PRN
Start: 2020-07-11 — End: 2020-07-11

## 2020-07-11 MED ORDER — SODIUM CHLORIDE 0.9 % IR SOLN
Status: DC | PRN
  Administered 2020-07-11: 6000 mL

## 2020-07-11 MED ORDER — KETOROLAC TROMETHAMINE 30 MG/ML IJ SOLN
INTRAMUSCULAR | Status: AC
  Filled 2020-07-11: qty 1

## 2020-07-11 MED ORDER — MIDAZOLAM HCL 5 MG/5ML IJ SOLN
INTRAMUSCULAR | Status: DC | PRN
  Administered 2020-07-11: 2 mg via INTRAVENOUS

## 2020-07-11 MED ORDER — OXYBUTYNIN CHLORIDE ER 10 MG PO TB24: 10.0000 mg | ORAL_TABLET | Freq: Every day | ORAL | 1 refills | Status: AC

## 2020-07-11 MED ORDER — DEXAMETHASONE SODIUM PHOSPHATE 10 MG/ML IJ SOLN
INTRAMUSCULAR | Status: AC
  Filled 2020-07-11: qty 1

## 2020-07-11 MED ORDER — CHLORHEXIDINE GLUCONATE 0.12 % MT SOLN
15.0000 mL | Freq: Once | OROMUCOSAL | Status: AC
  Administered 2020-07-11: 15 mL via OROMUCOSAL

## 2020-07-11 MED ORDER — OXYCODONE HCL 5 MG PO TABS: 5.0000 mg | ORAL_TABLET | Freq: Once | ORAL | Status: DC | PRN

## 2020-07-11 MED ORDER — OXYCODONE HCL 5 MG/5ML PO SOLN: 5.0000 mg | Freq: Once | ORAL | Status: DC | PRN

## 2020-07-11 MED ORDER — OXYCODONE-ACETAMINOPHEN 5-325 MG PO TABS: 1.0000 | ORAL_TABLET | ORAL | 0 refills | Status: DC | PRN

## 2020-07-11 SURGICAL SUPPLY — 27 items
BAG URINE DRAIN 2000ML AR STRL (UROLOGICAL SUPPLIES) ×3 IMPLANT
BAG URO CATCHER STRL LF (MISCELLANEOUS) ×3 IMPLANT
BASKET STONE NCOMPASS (UROLOGICAL SUPPLIES) ×3 IMPLANT
BASKET ZERO TIP NITINOL 2.4FR (BASKET) ×3 IMPLANT
CATH FOLEY 2W COUNCIL 5CC 18FR (CATHETERS) ×3 IMPLANT
CATH URET 5FR 28IN OPEN ENDED (CATHETERS) ×3 IMPLANT
CLOTH BEACON ORANGE TIMEOUT ST (SAFETY) ×3 IMPLANT
FIBER LASER MOSES 200 DFL (Laser) ×3 IMPLANT
GLOVE SURG ENC MOIS LTX SZ6.5 (GLOVE) ×3 IMPLANT
GLOVE SURG ENC TEXT LTX SZ7 (GLOVE) ×3 IMPLANT
GOWN STRL REUS W/TWL LRG LVL3 (GOWN DISPOSABLE) ×6 IMPLANT
GUIDEWIRE STR DUAL SENSOR (WIRE) ×12 IMPLANT
GUIDEWIRE ZIPWRE .038 STRAIGHT (WIRE) IMPLANT
IV NS 1000ML (IV SOLUTION) ×2
IV NS 1000ML BAXH (IV SOLUTION) ×1 IMPLANT
KIT TURNOVER KIT A (KITS) ×3 IMPLANT
LASER FIB FLEXIVA PULSE ID 365 (Laser) IMPLANT
MANIFOLD NEPTUNE II (INSTRUMENTS) ×3 IMPLANT
PACK CYSTO (CUSTOM PROCEDURE TRAY) ×3 IMPLANT
SHEATH URETERAL 12FR 45CM (SHEATH) ×3 IMPLANT
SHEATH URETERAL 12FRX35CM (MISCELLANEOUS) IMPLANT
STENT URET 6FRX26 CONTOUR (STENTS) ×3 IMPLANT
TRACTIP FLEXIVA PULS ID 200XHI (Laser) IMPLANT
TRACTIP FLEXIVA PULSE ID 200 (Laser)
TUBING CONNECTING 10 (TUBING) ×2 IMPLANT
TUBING CONNECTING 10' (TUBING) ×1
TUBING UROLOGY SET (TUBING) ×3 IMPLANT

## 2020-07-11 NOTE — Anesthesia Postprocedure Evaluation (Signed)
Anesthesia Post Note  Patient: Clayton Obrien  Procedure(s) Performed: CYSTOSCOPY/URETEROSCOPY/HOLMIUM LASER/STENT PLACEMENT (Right)     Patient location during evaluation: PACU Anesthesia Type: General Level of consciousness: awake and alert Pain management: pain level controlled Vital Signs Assessment: post-procedure vital signs reviewed and stable Respiratory status: spontaneous breathing, nonlabored ventilation, respiratory function stable and patient connected to nasal cannula oxygen Cardiovascular status: blood pressure returned to baseline and stable Postop Assessment: no apparent nausea or vomiting Anesthetic complications: no   No notable events documented.  Last Vitals:  Vitals:   07/11/20 1000 07/11/20 1015  BP:  (!) 170/99  Pulse:  84  Resp:  16  Temp: 37.1 C 36.6 C  SpO2:  97%    Last Pain:  Vitals:   07/11/20 1015  TempSrc:   PainSc: 3                  Shelton Silvas

## 2020-07-11 NOTE — Anesthesia Preprocedure Evaluation (Addendum)
Anesthesia Evaluation  Patient identified by MRN, date of birth, ID band Patient awake    Reviewed: Allergy & Precautions, NPO status , Patient's Chart, lab work & pertinent test results  Airway Mallampati: I  TM Distance: >3 FB Neck ROM: Full    Dental  (+) Missing,    Pulmonary neg pulmonary ROS,    Pulmonary exam normal        Cardiovascular hypertension, Pt. on home beta blockers  Rhythm:Regular Rate:Normal     Neuro/Psych negative neurological ROS  negative psych ROS   GI/Hepatic negative GI ROS, Neg liver ROS,   Endo/Other  negative endocrine ROS  Renal/GU negative Renal ROS     Musculoskeletal   Abdominal Normal abdominal exam  (+)   Peds  Hematology negative hematology ROS (+)   Anesthesia Other Findings   Reproductive/Obstetrics                            Anesthesia Physical Anesthesia Plan  ASA: 2  Anesthesia Plan: General   Post-op Pain Management:    Induction: Intravenous  PONV Risk Score and Plan: 3 and Ondansetron, Dexamethasone and Midazolam  Airway Management Planned: LMA  Additional Equipment: None  Intra-op Plan:   Post-operative Plan: Extubation in OR  Informed Consent: I have reviewed the patients History and Physical, chart, labs and discussed the procedure including the risks, benefits and alternatives for the proposed anesthesia with the patient or authorized representative who has indicated his/her understanding and acceptance.       Plan Discussed with: CRNA  Anesthesia Plan Comments:        Anesthesia Quick Evaluation

## 2020-07-11 NOTE — Transfer of Care (Signed)
Immediate Anesthesia Transfer of Care Note  Patient: Clayton Obrien  Procedure(s) Performed: CYSTOSCOPY/URETEROSCOPY/HOLMIUM LASER/STENT PLACEMENT (Right)  Patient Location: PACU  Anesthesia Type:General  Level of Consciousness: awake, alert  and oriented  Airway & Oxygen Therapy: Patient Spontanous Breathing and Patient connected to face mask oxygen  Post-op Assessment: Report given to RN and Post -op Vital signs reviewed and stable  Post vital signs: Reviewed and stable  Last Vitals:  Vitals Value Taken Time  BP    Temp    Pulse 87 07/11/20 0923  Resp 12 07/11/20 0923  SpO2 100 % 07/11/20 0923  Vitals shown include unvalidated device data.  Last Pain:  Vitals:   07/11/20 0548  TempSrc:   PainSc: 0-No pain         Complications: No notable events documented.

## 2020-07-11 NOTE — OR Nursing (Signed)
Stone taken by Dr. Gay. ?

## 2020-07-11 NOTE — Anesthesia Procedure Notes (Signed)
Procedure Name: LMA Insertion Date/Time: 07/11/2020 7:38 AM Performed by: Florene Route, CRNA Patient Re-evaluated:Patient Re-evaluated prior to induction Oxygen Delivery Method: Circle system utilized Preoxygenation: Pre-oxygenation with 100% oxygen Induction Type: IV induction Ventilation: Mask ventilation without difficulty LMA: LMA inserted LMA Size: 5.0 Number of attempts: 1 Placement Confirmation: positive ETCO2 and breath sounds checked- equal and bilateral Tube secured with: Tape Dental Injury: Teeth and Oropharynx as per pre-operative assessment

## 2020-07-12 ENCOUNTER — Encounter (HOSPITAL_COMMUNITY): Payer: Self-pay | Admitting: Urology

## 2020-08-14 IMAGING — DX DG CHEST 2V
2 series · 2 of 2 positions shown · non-contrast
Comparison: None.

CLINICAL DATA: Long-term medicine usage for Crohn's disease

EXAM:
CHEST - 2 VIEW

[dg chest 2 view (1 of 2)]
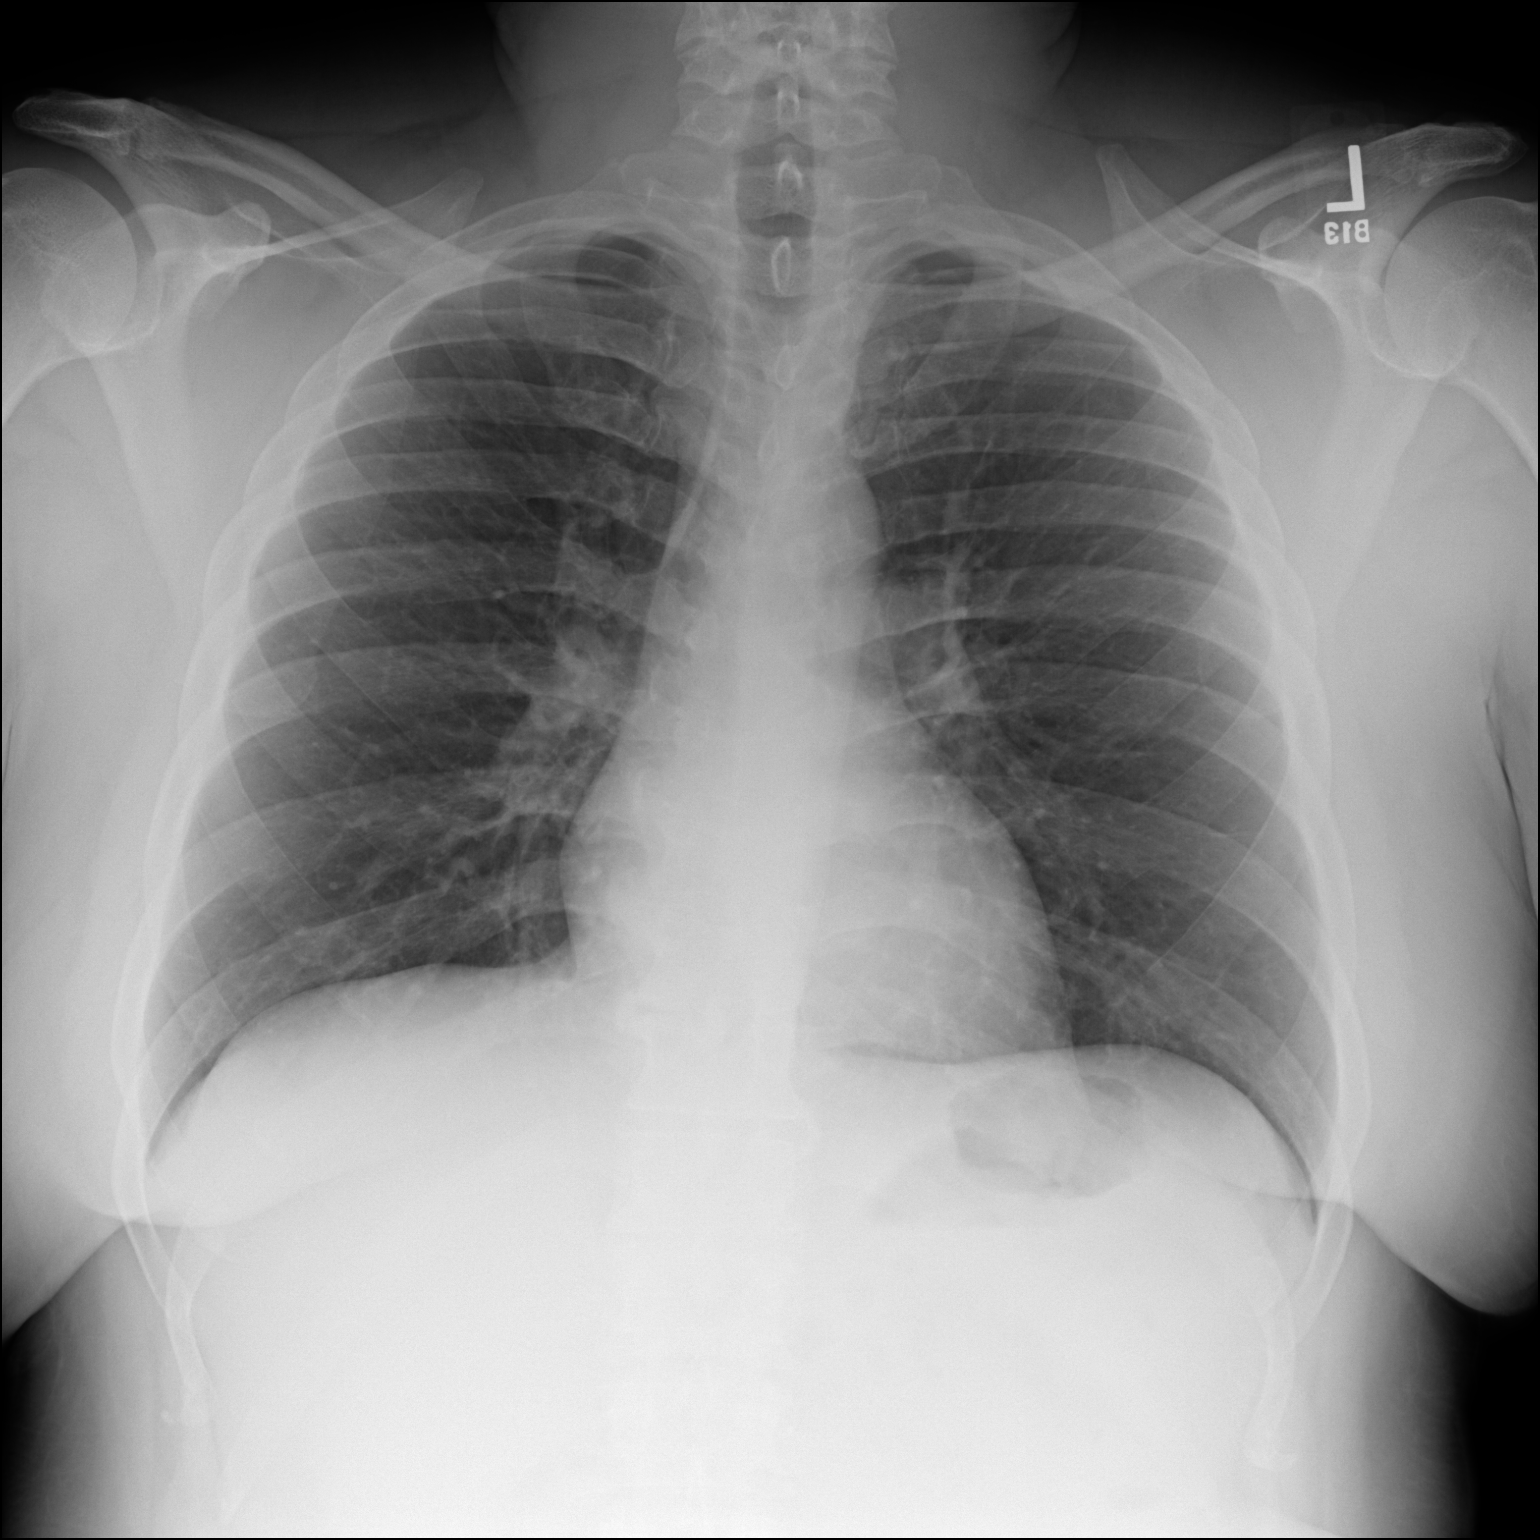

[dg chest 2 view (2 of 2)]
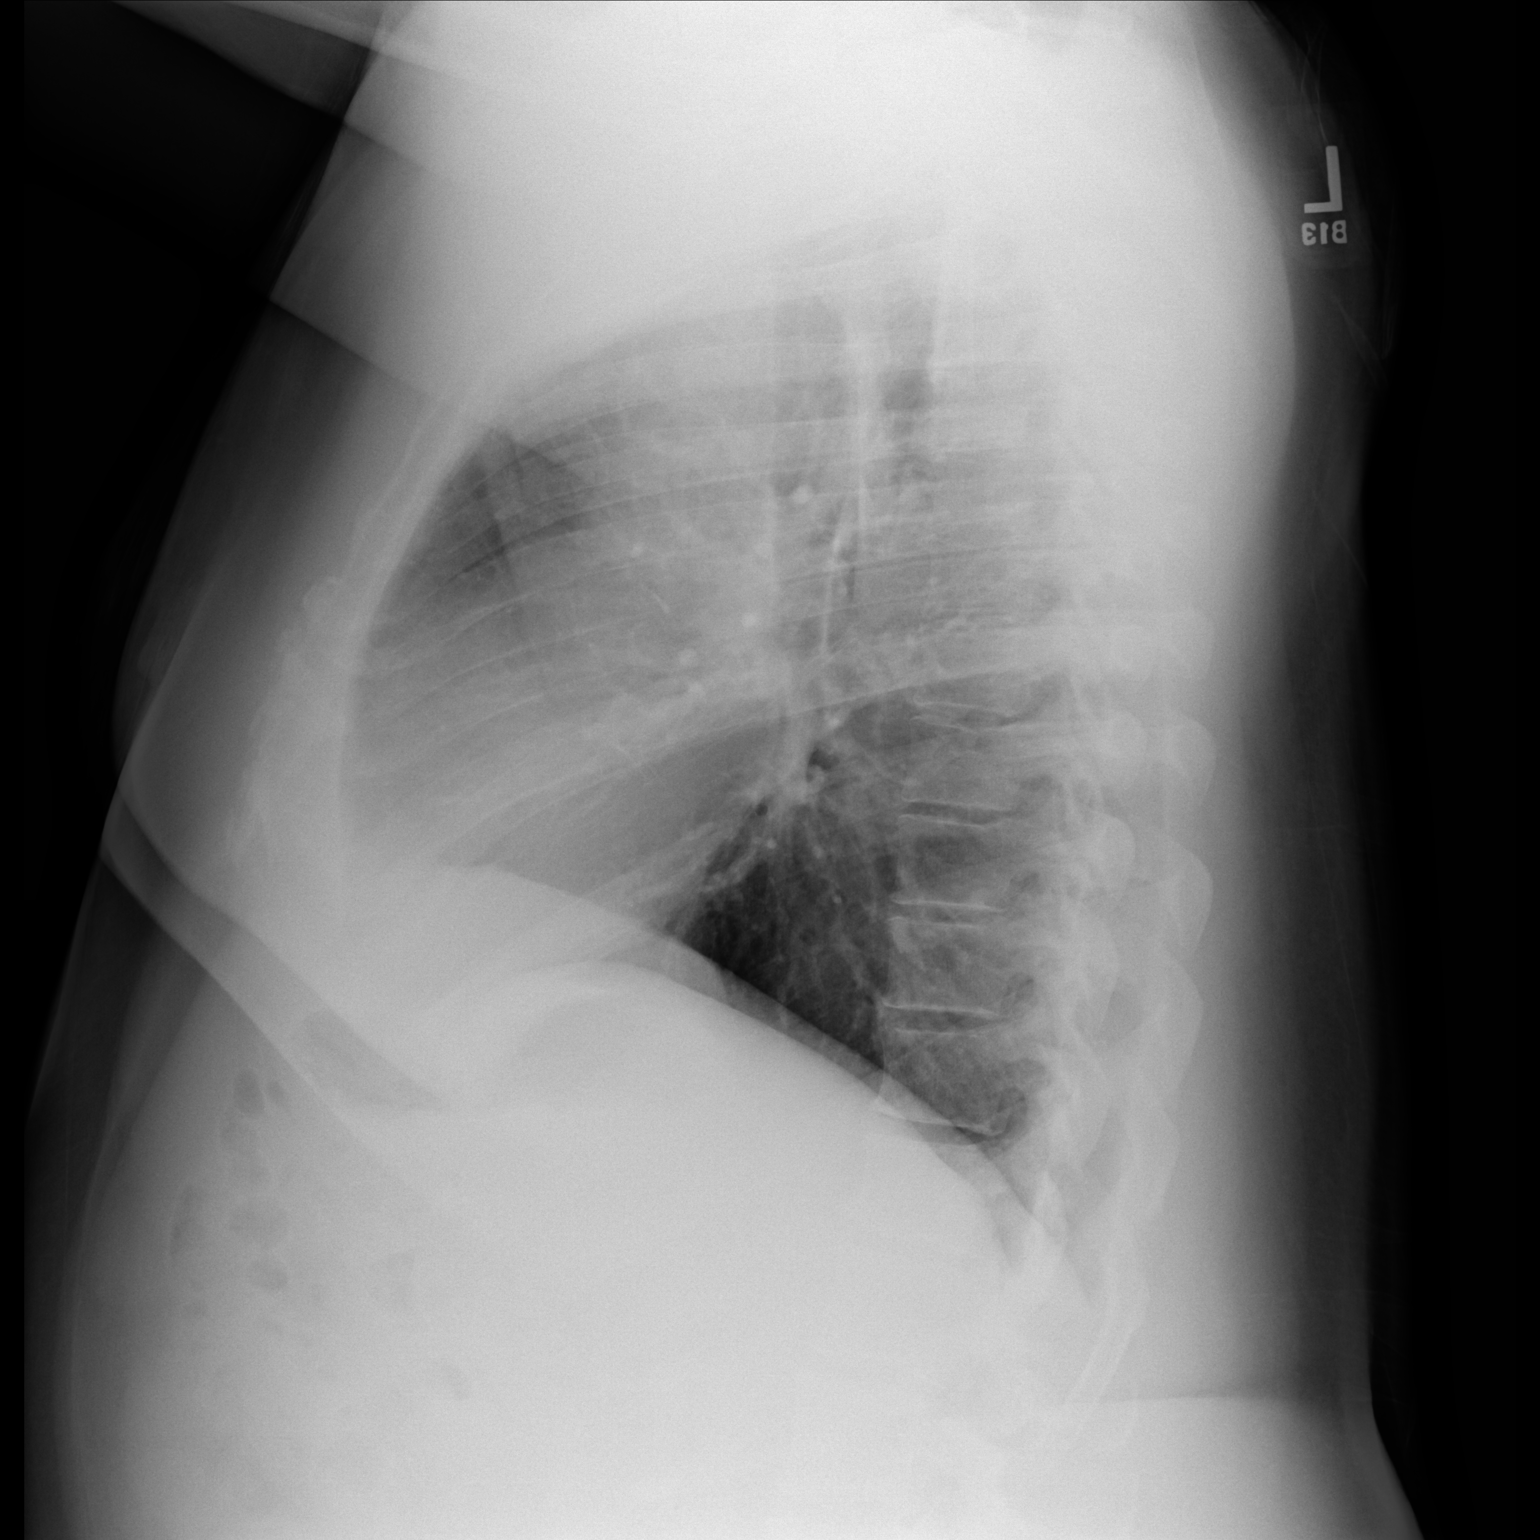

[2 of 2 positions shown; findings below may reference images not displayed]

FINDINGS: The heart size and mediastinal contours are within normal limits.
Both lungs are clear. The visualized skeletal structures are
unremarkable.
IMPRESSION: No active cardiopulmonary disease.

## 2020-11-03 ENCOUNTER — Ambulatory Visit: Payer: Self-pay | Admitting: Surgery

## 2021-05-21 ENCOUNTER — Encounter: Payer: Self-pay | Admitting: Podiatry

## 2021-05-21 ENCOUNTER — Ambulatory Visit (INDEPENDENT_AMBULATORY_CARE_PROVIDER_SITE_OTHER): Payer: 59

## 2021-05-21 ENCOUNTER — Ambulatory Visit: Payer: 59 | Admitting: Podiatry

## 2021-05-21 DIAGNOSIS — N35911 Unspecified urethral stricture, male, meatal: Secondary | ICD-10-CM | POA: Insufficient documentation

## 2021-05-21 DIAGNOSIS — Z800 Family history of malignant neoplasm of digestive organs: Secondary | ICD-10-CM | POA: Insufficient documentation

## 2021-05-21 DIAGNOSIS — M722 Plantar fascial fibromatosis: Secondary | ICD-10-CM | POA: Diagnosis not present

## 2021-05-21 DIAGNOSIS — N480 Leukoplakia of penis: Secondary | ICD-10-CM | POA: Insufficient documentation

## 2021-05-21 DIAGNOSIS — R7303 Prediabetes: Secondary | ICD-10-CM | POA: Insufficient documentation

## 2021-05-21 DIAGNOSIS — K649 Unspecified hemorrhoids: Secondary | ICD-10-CM | POA: Insufficient documentation

## 2021-05-21 DIAGNOSIS — E7800 Pure hypercholesterolemia, unspecified: Secondary | ICD-10-CM | POA: Insufficient documentation

## 2021-05-21 DIAGNOSIS — Q649 Congenital malformation of urinary system, unspecified: Secondary | ICD-10-CM | POA: Insufficient documentation

## 2021-05-21 DIAGNOSIS — K5010 Crohn's disease of large intestine without complications: Secondary | ICD-10-CM | POA: Insufficient documentation

## 2021-05-21 DIAGNOSIS — I10 Essential (primary) hypertension: Secondary | ICD-10-CM | POA: Insufficient documentation

## 2021-05-21 DIAGNOSIS — Q666 Other congenital valgus deformities of feet: Secondary | ICD-10-CM | POA: Diagnosis not present

## 2021-05-21 DIAGNOSIS — E8881 Metabolic syndrome: Secondary | ICD-10-CM | POA: Insufficient documentation

## 2021-05-21 DIAGNOSIS — H6523 Chronic serous otitis media, bilateral: Secondary | ICD-10-CM | POA: Insufficient documentation

## 2021-05-21 DIAGNOSIS — E559 Vitamin D deficiency, unspecified: Secondary | ICD-10-CM | POA: Insufficient documentation

## 2021-05-21 DIAGNOSIS — G4733 Obstructive sleep apnea (adult) (pediatric): Secondary | ICD-10-CM | POA: Insufficient documentation

## 2021-05-21 DIAGNOSIS — R7989 Other specified abnormal findings of blood chemistry: Secondary | ICD-10-CM | POA: Insufficient documentation

## 2021-05-21 MED ORDER — METHYLPREDNISOLONE 4 MG PO TBPK: ORAL_TABLET | ORAL | 0 refills | Status: AC

## 2021-05-21 MED ORDER — TRIAMCINOLONE ACETONIDE 40 MG/ML IJ SUSP
20.0000 mg | Freq: Once | INTRAMUSCULAR | Status: AC
  Administered 2021-05-21: 20 mg

## 2021-05-21 NOTE — Progress Notes (Signed)
?Subjective:  ?Patient ID: Clayton Obrien, male    DOB: 02-06-1970,  MRN: [id number] ?HPI ?Chief Complaint  ?Patient presents with  ? Foot Pain  ?  Lateral foot/plantar heel left - aching x few weeks, AM pain is better now, stretching, water bottle rolling, night boot, been walking a lot more recently  ? New Patient (Initial Visit)  ? ? ?51 y.o. male presents with the above complaint.  ? ?ROS: Denies fever chills nausea vomit muscle aches pains calf pain back pain chest pain shortness of breath. ? ?Past Medical History:  ?Diagnosis Date  ? Crohn's disease (HCC)   ? Hypertension   ? Pre-diabetes   ? ?Past Surgical History:  ?Procedure Laterality Date  ? CYSTOSCOPY/URETEROSCOPY/HOLMIUM LASER/STENT PLACEMENT Right 07/11/2020  ? Procedure: CYSTOSCOPY/URETEROSCOPY/HOLMIUM LASER/STENT PLACEMENT;  Surgeon: Jannifer Hick, MD;  Location: [facility];  Service: Urology;  Laterality: Right;  ? SCROTUM EXPLORATION    ? removal of a boil  ? ? ?Current Outpatient Medications:  ?  CINNAMON PO, Take by mouth., Disp: , Rfl:  ?  methylPREDNISolone (MEDROL DOSEPAK) 4 MG TBPK tablet, 6 day dose pack - take as directed, Disp: 21 tablet, Rfl: 0 ?  TURMERIC PO, Take by mouth., Disp: , Rfl:  ?  atenolol-chlorthalidone (TENORETIC) 50-25 MG tablet, Take 1 tablet by mouth daily., Disp: , Rfl:  ?  azaTHIOprine (IMURAN) 50 MG tablet, Take 50 mg by mouth daily., Disp: , Rfl:  ?  KRILL OIL PO, Take 1 capsule by mouth daily., Disp: , Rfl:  ?  Potassium Citrate 15 MEQ (1620 MG) TBCR, Take 1 tablet by mouth 2 (two) times daily., Disp: , Rfl:  ? ?Allergies  ?Allergen Reactions  ? Nsaids Other (See Comments)  ?  Crohns disease = NO NSAIDs  ? Bee Pollen Other (See Comments)  ? Pollen Extract Cough  ? ?Review of Systems ?Objective:  ?There were no vitals filed for this visit. ? ?General: Well developed, nourished, in no acute distress, alert and oriented x3  ? ?Dermatological: Skin is warm, dry and supple bilateral. Nails x 10 are well maintained; remaining  integument appears unremarkable at this time. There are no open sores, no preulcerative lesions, no rash or signs of infection present. ? ?Vascular: Dorsalis Pedis artery and Posterior Tibial artery pedal pulses are 2/4 bilateral with immedate capillary fill time. Pedal hair growth present. No varicosities and no lower extremity edema present bilateral.  ? ?Neruologic: Grossly intact via light touch bilateral. Vibratory intact via tuning fork bilateral. Protective threshold with Semmes Wienstein monofilament intact to all pedal sites bilateral. Patellar and Achilles deep tendon reflexes 2+ bilateral. No Babinski or clonus noted bilateral.  ? ?Musculoskeletal: No gross boney pedal deformities bilateral. No pain, crepitus, or limitation noted with foot and ankle range of motion bilateral. Muscular strength 5/5 in all groups tested bilateral.  Flexible pes planus is noted.  He also has pain on palpation of the plantar fascia medial band just distal to the insertion site more in line with the navicular area.  He has no tenderness on palpation of the posterior tibial tendon.  He does have some tenderness on palpation of the fourth fifth metatarsal cuboid articulation. ? ?Gait: Unassisted, Nonantalgic.  ? ? ?Radiographs: ? ?Radiographs taken today demonstrate an osseously mature individual with some mild midfoot osteoarthritis dorsally.  He also demonstrates no significant osseous abnormalities he does have a large dieters process of the posterior talus and some posterior spurring of the calcaneus.  No thickening of  the Achilles.  Mild thickening of the plantar fascia insertion site. ? ? ? ?Assessment & Plan:  ? ?Assessment: Planter fasciitis pes planovalgus. ? ? ? ?Plan: Discussed etiology pathology and surgical therapies secondary to Crohn's we cannot use nonsteroidals on him but we will go ahead and do methylprednisolone and an injection of Kenalog.  Start him on a plantar fascia brace discussed appropriate shoe gear  stretching exercise and ice therapy I like to follow-up with him in 1 month ? ? ? ? ?Saleema Weppler T. Francisco, DPM ?

## 2021-07-07 ENCOUNTER — Ambulatory Visit: Payer: 59 | Admitting: Podiatry

## 2023-04-21 IMAGING — CT CT ABD-PEL WO/W CM
2 of 9 series · 11 of 46 positions shown, 17 images · IV contrast (iopamidol)
Comparison: None.

CLINICAL DATA: Recurrent urinary tract infection. Urethral
stricture. Symptoms for 1 month. No previous relevant surgery or
history of malignancy.

EXAM:
CT ABDOMEN AND PELVIS WITHOUT AND WITH CONTRAST
TECHNIQUE: Multidetector CT imaging of the abdomen and pelvis was performed
following the standard protocol before and following the bolus
administration of intravenous contrast.
CONTRAST:  100mL [id number] IOPAMIDOL ([id number]) INJECTION 61%

[Series 2: abd pelvis pre 5.00 br40 s3 axial · axial · non-contrast · 0.96mm/px · z∈[+1244,+1649]mm · 8 of 105 slices shown, 13 images]
[im 12/105  soft-tissue]
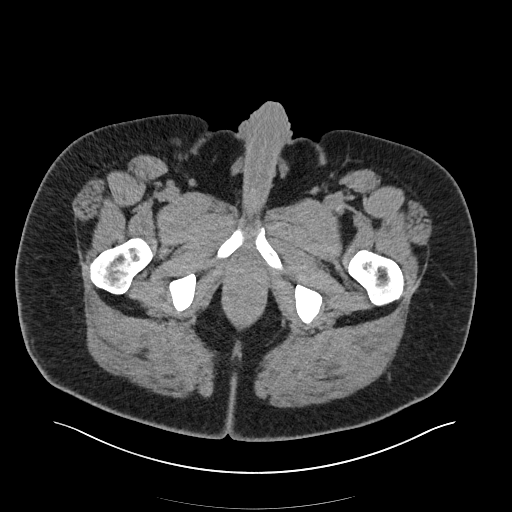
[im 12/105  bone]
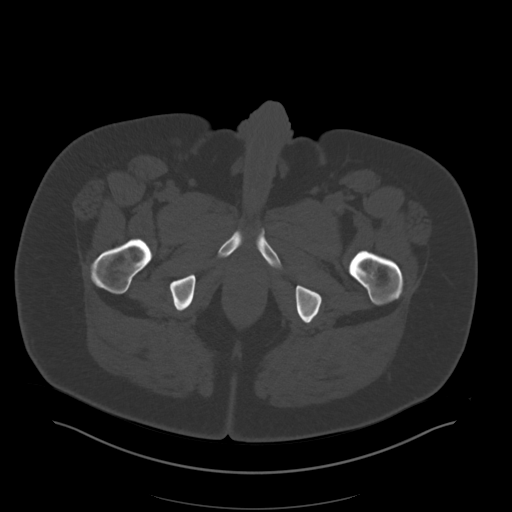
[im 24/105  soft-tissue]
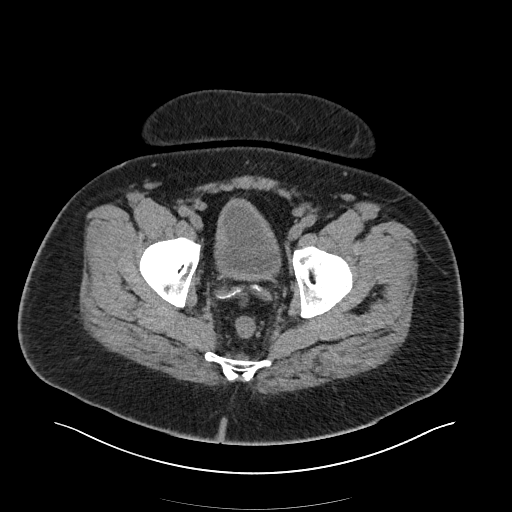
[im 35/105  soft-tissue]
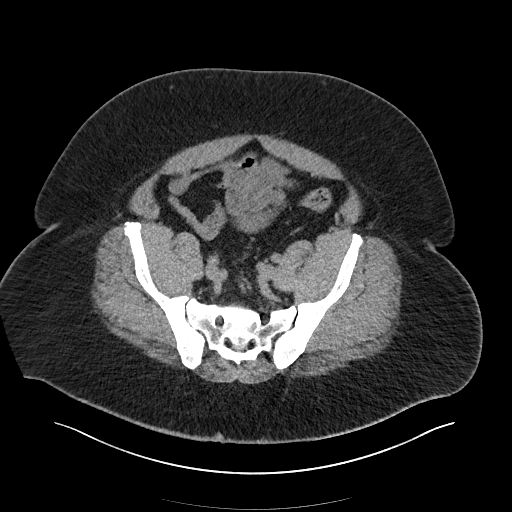
[im 47/105  soft-tissue]
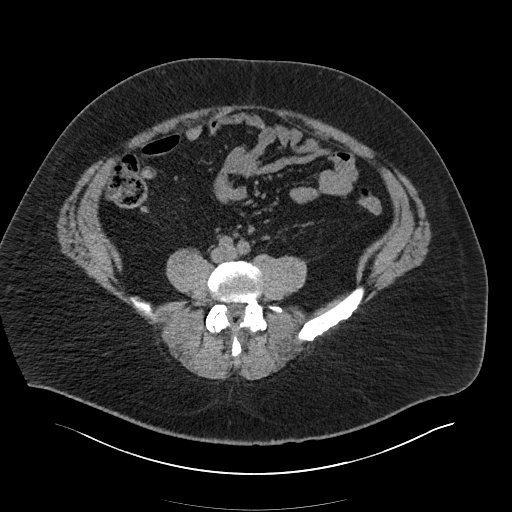
[im 58/105  soft-tissue]
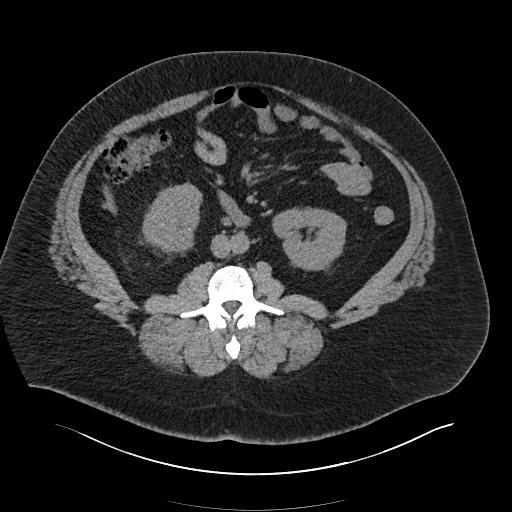
[im 58/105  lung]
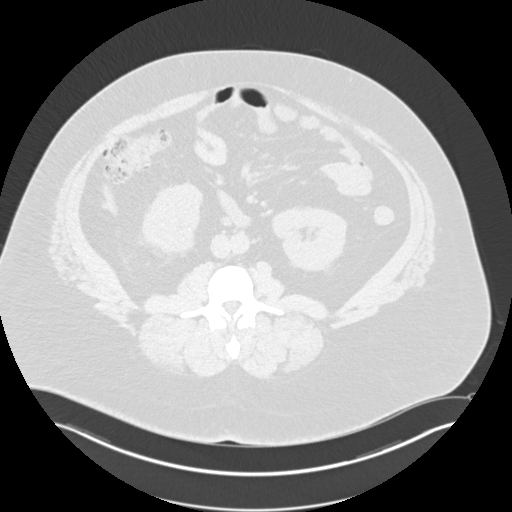
[im 70/105  soft-tissue]
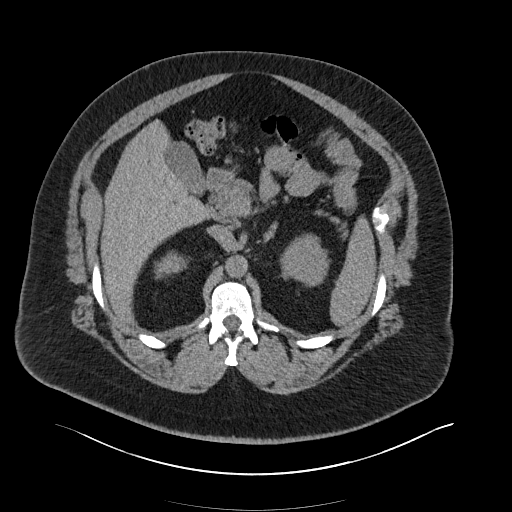
[im 70/105  lung]
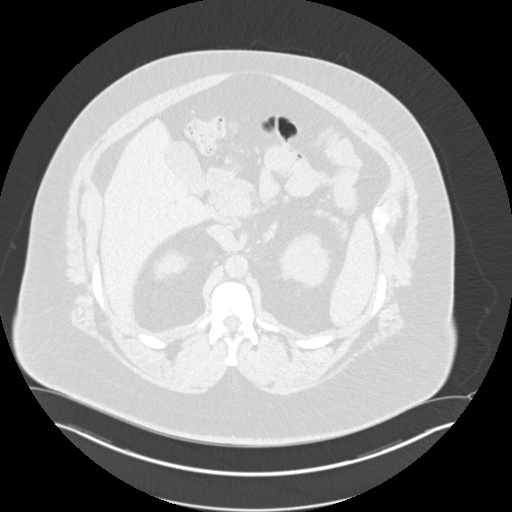
[im 81/105  soft-tissue]
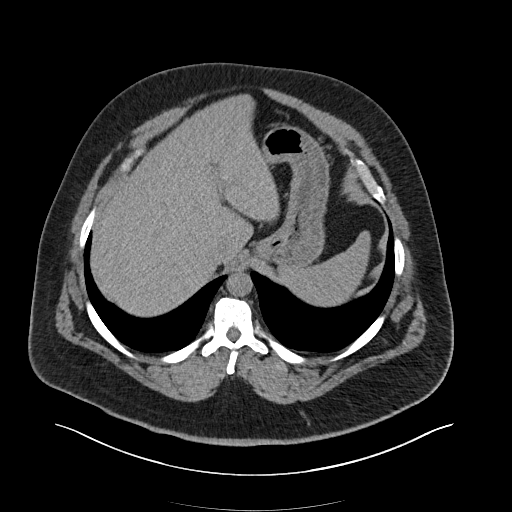
[im 81/105  lung]
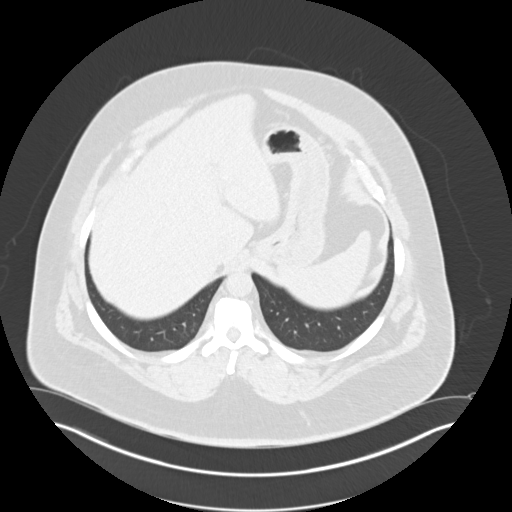
[im 93/105  soft-tissue]
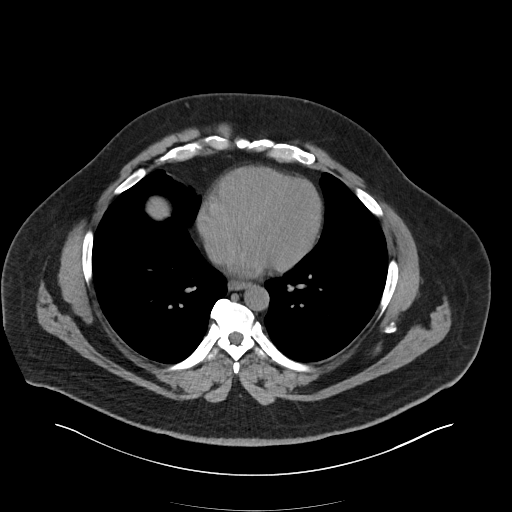
[im 93/105  lung]
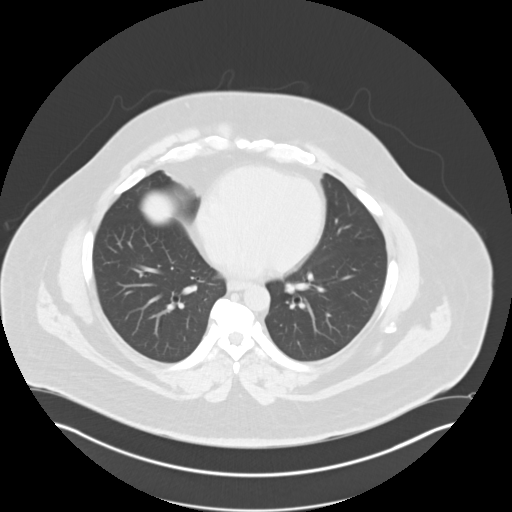

[Series 6: abd pelvis pre 2.00 br40 s3 cor · coronal · non-contrast · 0.96mm/px · 3 of 245 slices shown, 4 images]
[im 62/245  soft-tissue]
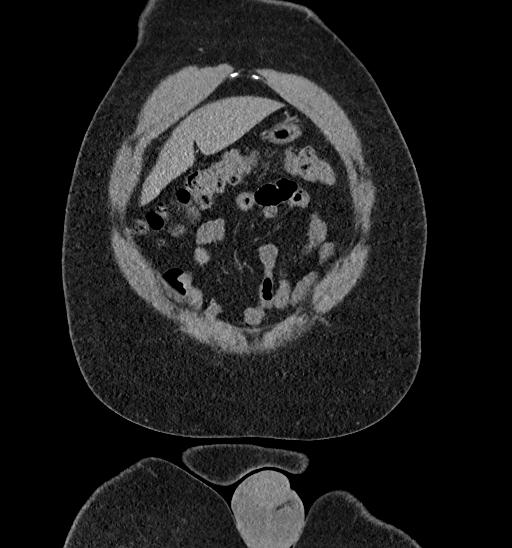
[im 123/245  soft-tissue]
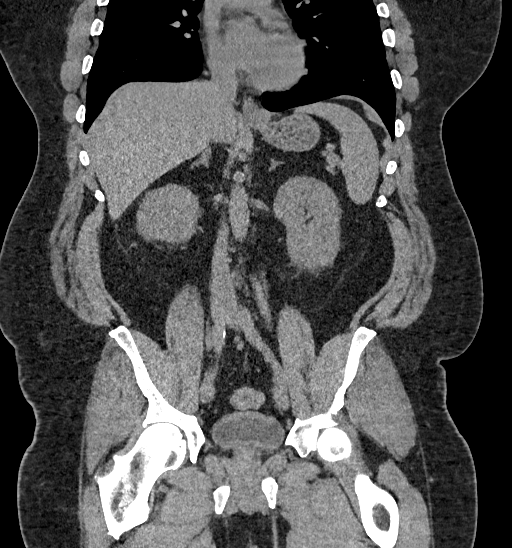
[im 123/245  bone]
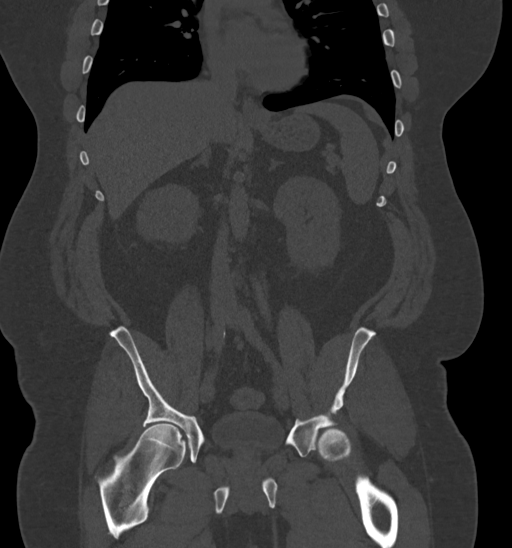
[im 184/245  soft-tissue]
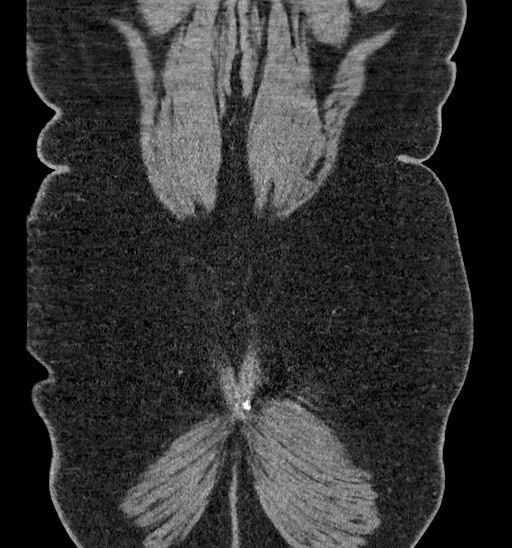

[11 of 46 positions shown; findings below may reference images not displayed]

FINDINGS: Lower chest: Clear lung bases. No significant pleural or pericardial
effusion. Mild coronary artery atherosclerosis noted.

Hepatobiliary: The liver is normal in density without suspicious
focal abnormality. No evidence of gallstones, gallbladder wall
thickening or biliary dilatation.

Pancreas: Unremarkable. No pancreatic ductal dilatation or
surrounding inflammatory changes.

Spleen: Normal in size without focal abnormality.

Adrenals/Urinary Tract: Both adrenal glands appear normal. Pre
contrast images demonstrate a large calculus in the right renal
pelvis, measuring up to 1.7 cm on image 44/2. This appears visible
on the scout image. There is a punctate 3 mm nonobstructing calculus
in the lower pole of the left kidney. No evidence of ureteral or
bladder calculus. Post-contrast, both kidneys enhance normally.
There is no evidence of enhancing renal mass. There are low-density
renal cysts bilaterally, measuring up to 1.7 cm posteriorly in the
mid right kidney (image 25/20) and 2.4 cm on the left (image 23/20).
Delayed post-contrast images through the kidneys demonstrate mild
right-sided caliectasis and delayed contrast excretion on the right
with non opacification of the proximal right ureter. There is no
collecting system dilatation on the left or perinephric soft tissue
stranding. There is mild bladder wall thickening which may relate to
incomplete distension. No focal bladder abnormality identified.

Stomach/Bowel: No enteric contrast administered. The stomach appears
unremarkable for its degree of distension. No evidence of bowel wall
thickening, distention or surrounding inflammatory change. The
appendix appears normal.

Vascular/Lymphatic: There are no enlarged abdominal or pelvic lymph
nodes. No significant vascular findings.

Reproductive: The prostate gland and seminal vesicles appear
unremarkable.

Other: Small umbilical hernia containing only fat.  No ascites.

Musculoskeletal: No acute or significant osseous findings. Mild
facet disease in the lumbar spine.
IMPRESSION: 1. Partially obstructing large (1.7 cm) calculus in the right renal
pelvis with mild caliectasis and delayed contrast excretion.
2. Tiny nonobstructing calculus in the lower pole of the left
kidney. No evidence of ureteral calculus or perinephric soft tissue
stranding.
3. No evidence of renal mass.  Small bilateral renal cysts.
4. Mild nonspecific bladder wall thickening without apparent focal
abnormality.
5. Mild coronary artery atherosclerosis.

## 2024-02-09 ENCOUNTER — Institutional Professional Consult (permissible substitution) (HOSPITAL_BASED_OUTPATIENT_CLINIC_OR_DEPARTMENT_OTHER): Admitting: Internal Medicine

## 2024-04-06 ENCOUNTER — Institutional Professional Consult (permissible substitution) (HOSPITAL_BASED_OUTPATIENT_CLINIC_OR_DEPARTMENT_OTHER): Admitting: Internal Medicine
